# Patient Record
Sex: Female | Born: 1995 | Race: White | Hispanic: No | Marital: Single | Smoking: Never smoker
Health system: Southern US, Community
[De-identification: ages and names within clinical notes are randomized; demographics above are authoritative.]

## PROBLEM LIST (undated history)

## (undated) DIAGNOSIS — F419 Anxiety disorder, unspecified: Secondary | ICD-10-CM

## (undated) DIAGNOSIS — R51 Headache: Secondary | ICD-10-CM

## (undated) DIAGNOSIS — L309 Dermatitis, unspecified: Principal | ICD-10-CM

## (undated) DIAGNOSIS — F329 Major depressive disorder, single episode, unspecified: Secondary | ICD-10-CM

## (undated) DIAGNOSIS — R110 Nausea: Secondary | ICD-10-CM

## (undated) DIAGNOSIS — H6692 Otitis media, unspecified, left ear: Secondary | ICD-10-CM

## (undated) DIAGNOSIS — R519 Headache, unspecified: Secondary | ICD-10-CM

## (undated) DIAGNOSIS — J029 Acute pharyngitis, unspecified: Principal | ICD-10-CM

## (undated) DIAGNOSIS — IMO0001 Reserved for inherently not codable concepts without codable children: Secondary | ICD-10-CM

## (undated) DIAGNOSIS — K219 Gastro-esophageal reflux disease without esophagitis: Secondary | ICD-10-CM

## (undated) DIAGNOSIS — G4700 Insomnia, unspecified: Secondary | ICD-10-CM

## (undated) DIAGNOSIS — B019 Varicella without complication: Secondary | ICD-10-CM

## (undated) HISTORY — DX: Insomnia, unspecified: G47.00

## (undated) HISTORY — DX: Headache, unspecified: R51.9

## (undated) HISTORY — DX: Varicella without complication: B01.9

## (undated) HISTORY — DX: Headache: R51

## (undated) HISTORY — DX: Dermatitis, unspecified: L30.9

## (undated) HISTORY — DX: Nausea: R11.0

## (undated) HISTORY — DX: Reserved for inherently not codable concepts without codable children: IMO0001

## (undated) HISTORY — DX: Gastro-esophageal reflux disease without esophagitis: K21.9

## (undated) HISTORY — DX: Major depressive disorder, single episode, unspecified: F32.9

## (undated) HISTORY — DX: Otitis media, unspecified, left ear: H66.92

## (undated) HISTORY — DX: Anxiety disorder, unspecified: F41.9

## (undated) HISTORY — DX: Acute pharyngitis, unspecified: J02.9

---
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
## 2016-12-28 ENCOUNTER — Encounter: Payer: Self-pay | Admitting: Internal Medicine

## 2016-12-28 ENCOUNTER — Ambulatory Visit (INDEPENDENT_AMBULATORY_CARE_PROVIDER_SITE_OTHER): Payer: BLUE CROSS/BLUE SHIELD | Admitting: Internal Medicine

## 2016-12-28 VITALS — BP 108/74 | HR 92 | Temp 97.2°F | Resp 16 | Ht 63.0 in | Wt 113.0 lb

## 2016-12-28 DIAGNOSIS — J0410 Acute tracheitis without obstruction: Secondary | ICD-10-CM

## 2016-12-28 DIAGNOSIS — J029 Acute pharyngitis, unspecified: Secondary | ICD-10-CM | POA: Diagnosis not present

## 2016-12-28 MED ORDER — PREDNISONE 10 MG PO TABS: ORAL_TABLET | ORAL | 0 refills | Status: DC

## 2016-12-28 NOTE — Patient Instructions (Signed)

## 2016-12-28 NOTE — Progress Notes (Signed)
[facility]   Lucky Cowboy, M.D.    Dyanne Carrel. Steffanie Dunn, P.A.-C      Judd Gaudier, DNP  [facility]                [address]                [place], [place]. [address] Telephone [phone] Telefax [phone] _________________________________ Subjective:    Patient ID: Megan Backbone., female    DOB: 1995-12-26, 21 y.o.   MRN: [id number]  HPI  This nice single WF presents as a new patient with c/o ST x 3 days. Describes a itching sensation in the back of her throat. No sneezing, itchy / watery eyes or nose.  Has has a dry non-productive cough. No fever, chills or sweats.   Medication Sig  . escitalopram (LEXAPRO) 10 MG tablet Take 1 tablet (10 mg total) by mouth daily.   Allergies  Allergen Reactions  . Prilosec [Omeprazole] Nausea And Vomiting   Past Medical History:  Diagnosis Date  . Anxiety and depression 12/10/2011  . Chicken pox as a child  . Dermatitis 03/02/2012  . Headache disorder   . Insomnia 12/10/2011  . Nausea alone 07/10/2012  . Otitis media of left ear 07/22/2011  . Pharyngitis 07/22/2011  . Reflux    Review of Systems  10 point systems review negative except as above.    Objective:   Physical Exam  BP 108/74   Pulse 92   Temp (!) 97.2 F (36.2 C)   Resp 16   Ht  (1.6 m)   Wt 113 lb (51.3 kg)   BMI 20.02 kg/m   Dry cough. No Stridor. No Rash.   HEENT - EAC's patent. TM's Nl. EOM's full. PERRLA.No Sinus tenderness.  NasoOroPharynx clear. No exudates.  1+ erythema of anterior tonsillar fauces. Neck - supple. Nl Thyroid. Carotids 2+ & No bruits, JVD. Sl tender Lt anterior Cx LN.  Chest - Clear equal BS w/o ales, rhonchi, wheezes. Cor - Nl HS. RRR w/o sig MGR. PP 1(+). No edema. Abd - No palpable organomegaly, masses or tenderness. BS nl. MS- FROM w/o deformities.  Gait Nl. Neuro - No obvious Cr N abnormalities.  Nl w/o focal abnormalities.  Assessment & Plan:   1.  Pharyngitis suspect viral vs Allergy   2. Tracheitis  - predniSONE (DELTASONE) 10 MG tablet; 1 tab 3 x day for 2 days, then 1 tab 2 x day for 2 days, then 1 tab 1 x day for 3 days  Dispense: 13 tablet; Refill: 0  - discussed meds/SE's.   - Advised call for Abx,  if develops fever or purulent secrretions

## 2017-04-01 ENCOUNTER — Other Ambulatory Visit: Payer: Self-pay | Admitting: *Deleted

## 2017-04-01 DIAGNOSIS — F32A Depression, unspecified: Secondary | ICD-10-CM

## 2017-04-01 DIAGNOSIS — F329 Major depressive disorder, single episode, unspecified: Secondary | ICD-10-CM

## 2017-04-01 DIAGNOSIS — F419 Anxiety disorder, unspecified: Principal | ICD-10-CM

## 2017-04-01 MED ORDER — ESCITALOPRAM OXALATE 10 MG PO TABS: 10.0000 mg | ORAL_TABLET | Freq: Every day | ORAL | 0 refills | Status: DC

## 2017-06-16 ENCOUNTER — Encounter: Payer: Self-pay | Admitting: Adult Health

## 2017-07-01 ENCOUNTER — Ambulatory Visit (INDEPENDENT_AMBULATORY_CARE_PROVIDER_SITE_OTHER): Payer: BLUE CROSS/BLUE SHIELD | Admitting: Adult Health

## 2017-07-01 ENCOUNTER — Encounter: Payer: Self-pay | Admitting: Adult Health

## 2017-07-01 VITALS — BP 106/72 | HR 87 | Temp 97.9°F | Ht 63.0 in | Wt 118.0 lb

## 2017-07-01 DIAGNOSIS — L249 Irritant contact dermatitis, unspecified cause: Secondary | ICD-10-CM | POA: Diagnosis not present

## 2017-07-01 MED ORDER — TRIAMCINOLONE ACETONIDE 0.1 % EX OINT
1.0000 | TOPICAL_OINTMENT | Freq: Two times a day (BID) | CUTANEOUS | 1 refills | Status: AC
Start: 2017-07-01 — End: ?

## 2017-07-01 NOTE — Patient Instructions (Signed)

## 2017-07-01 NOTE — Progress Notes (Signed)
Assessment and Plan:  Megan Lawrence was seen today for rash.  Diagnoses and all orders for this visit:  Irritant contact dermatitis, unspecified trigger Wear gloves at work or when working with chemicals or other new products, avoid fragranced products or alcohol on hands. Use heavy moisturizer at night and after hand-washing. Apply prescription cream twice daily. Follow up if not improving in 4 weeks.  -     triamcinolone ointment (KENALOG) 0.1 %; Apply 1 application topically 2 (two) times daily.  Further disposition pending results of labs. Discussed med's effects and SE's.   Over 15 minutes of exam, counseling, chart review, and critical decision making was performed.   No future appointments.  ------------------------------------------------------------------------------------------------------------------   HPI BP 106/72   Pulse 87   Temp 97.9 F (36.6 C)   Ht 5\' 3"  (1.6 m)   Wt 118 lb (53.5 kg)   SpO2 97%   BMI 20.90 kg/m   22 y.o.female presents for concerns of red, cracked/irritated/itchy hands ongoing for 3 weeks. 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 She does not wear perfumed creams on her hands at home.   She denies other rashes, fever/chills/achiness, cough, nausea, dizziness, swelling or other new symptoms.   Past Medical History:  Diagnosis Date  . Anxiety and depression 12/10/2011  . Chicken pox as a child  . Dermatitis 03/02/2012  . Headache disorder   . Insomnia 12/10/2011  . Nausea alone 07/10/2012  . Otitis media of left ear 07/22/2011  . Pharyngitis 07/22/2011  . Reflux      Allergies  Allergen Reactions  . Prilosec [Omeprazole] Nausea And Vomiting    Current Outpatient Medications on File Prior to Visit  Medication Sig  . escitalopram (LEXAPRO) 10 MG tablet Take 1 tablet (10 mg total) by mouth daily.  . predniSONE (DELTASONE) 10 MG tablet 1 tab 3 x day for 2 days, then 1 tab 2 x day for 2  days, then 1 tab 1 x day for 3 days   No current facility-administered medications on file prior to visit.     ROS: all negative except above.   Physical Exam:  BP 106/72   Pulse 87   Temp 97.9 F (36.6 C)   Ht 5\' 3"  (1.6 m)   Wt 118 lb (53.5 kg)   SpO2 97%   BMI 20.90 kg/m   General Appearance: Well nourished, in no apparent distress. Eyes: PERRLA, EOMs, conjunctiva no swelling or erythema ENT/Mouth:  No erythema, swelling, or exudate on post pharynx.  Tonsils not swollen or erythematous. Hearing normal.  Neck: Supple.  Respiratory: Respiratory effort normal, BS equal bilaterally without rales, rhonchi, wheezing or stridor.  Cardio: RRR with no MRGs. Brisk peripheral pulses without edema.  Abdomen: Soft, + BS.  Non tender. Lymphatics: Non tender without lymphadenopathy.  Musculoskeletal: normal gait.  Skin: Warm, dry; bilateral hands with dry/erythematous/irritated with fissures. No distinct lesions or discharge.   Psych: Awake and oriented X 3, anxious affect, Insight and Judgment appropriate.     Megan MakerAshley C Lincoln Kleiner, NP 6:18 PM [facility] : [facility]

## 2017-08-06 ENCOUNTER — Other Ambulatory Visit: Payer: Self-pay | Admitting: Internal Medicine

## 2017-08-06 DIAGNOSIS — F32A Depression, unspecified: Secondary | ICD-10-CM

## 2017-08-06 DIAGNOSIS — F419 Anxiety disorder, unspecified: Principal | ICD-10-CM

## 2017-08-06 DIAGNOSIS — F329 Major depressive disorder, single episode, unspecified: Secondary | ICD-10-CM

## 2017-10-13 IMAGING — CR DG CHEST 2V
2 series · 2 of 2 positions shown · non-contrast
Comparison: None.

CLINICAL DATA: Bronchitis 2 months ago.  Lingering cough.

EXAM:
CHEST  2 VIEW

[w chest pa 8-[id] (15-22cm)]
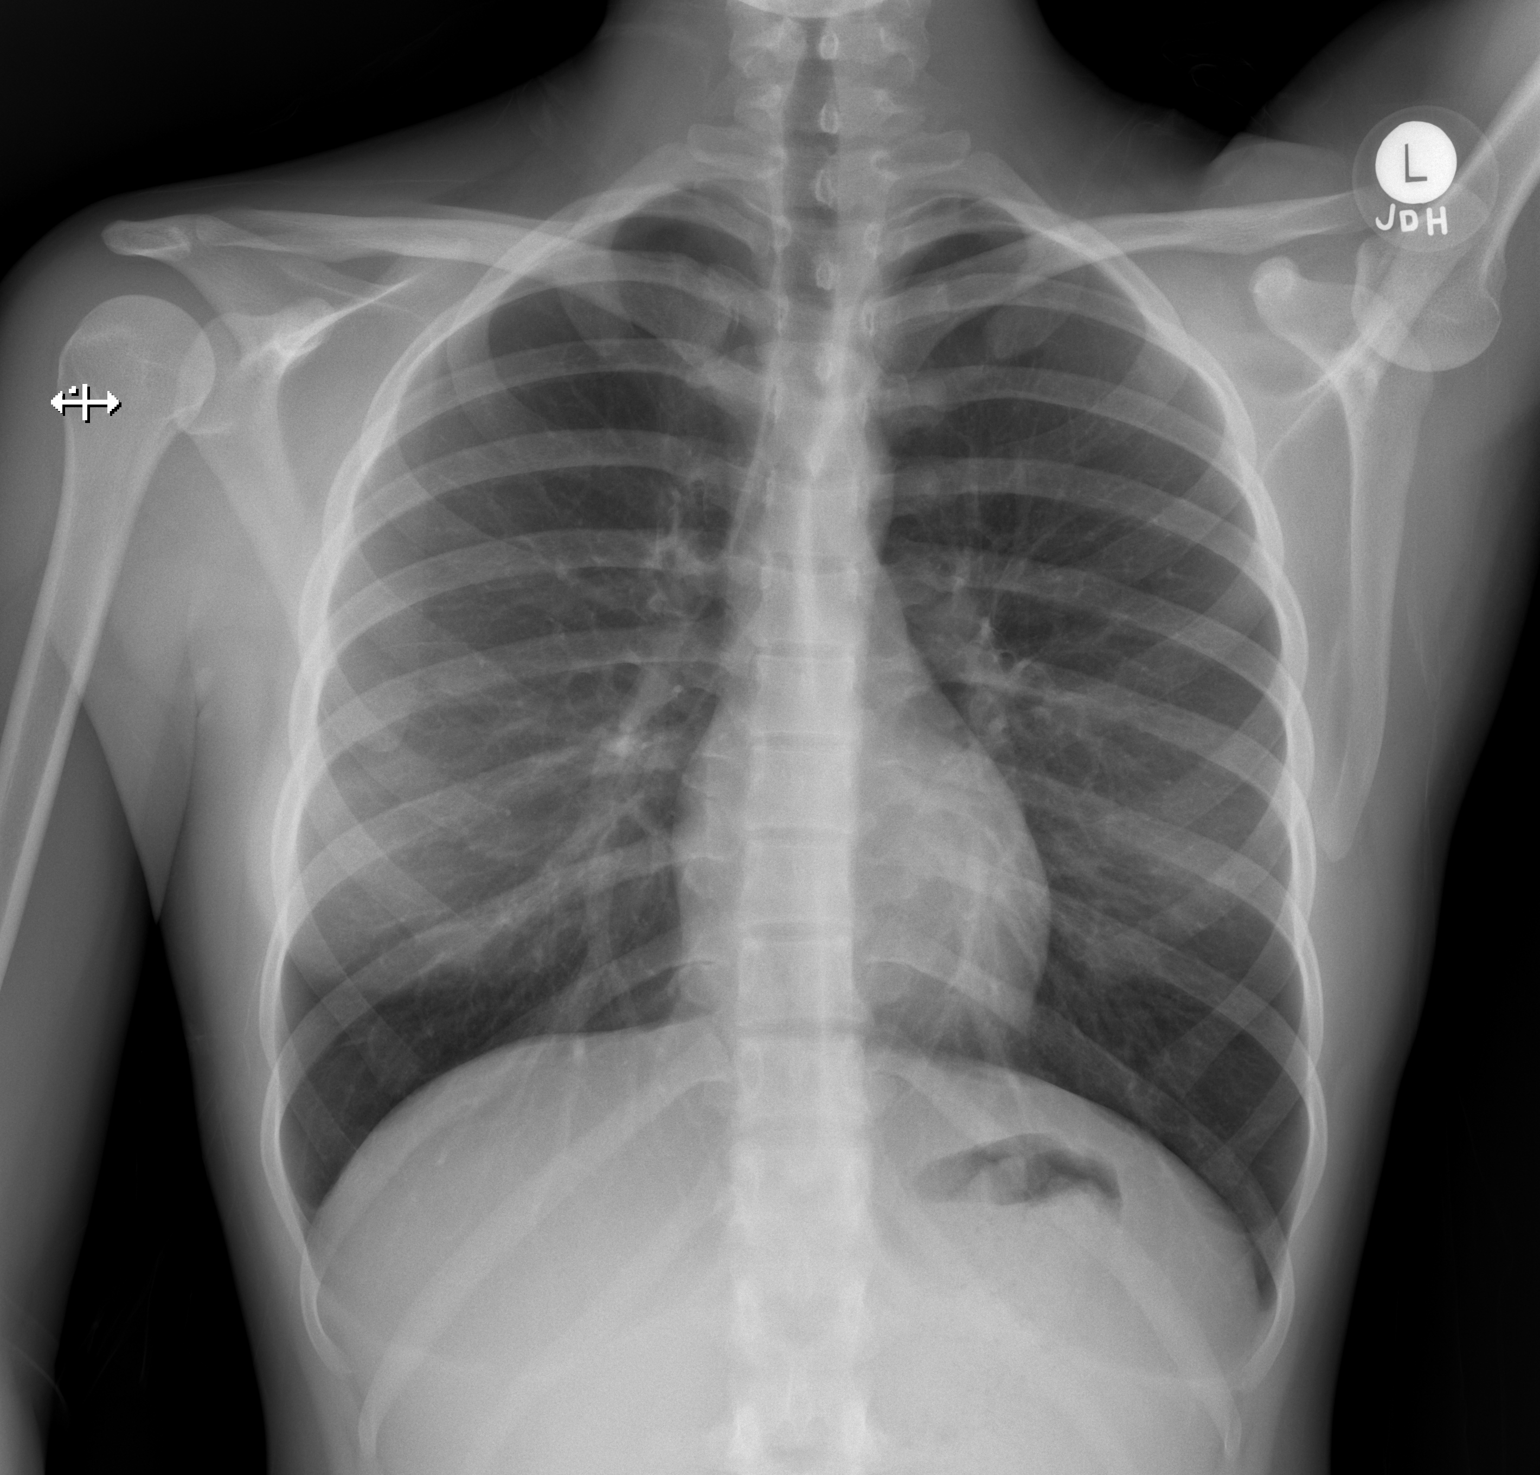

[w chest lat 8-[id] (21-28cm)]
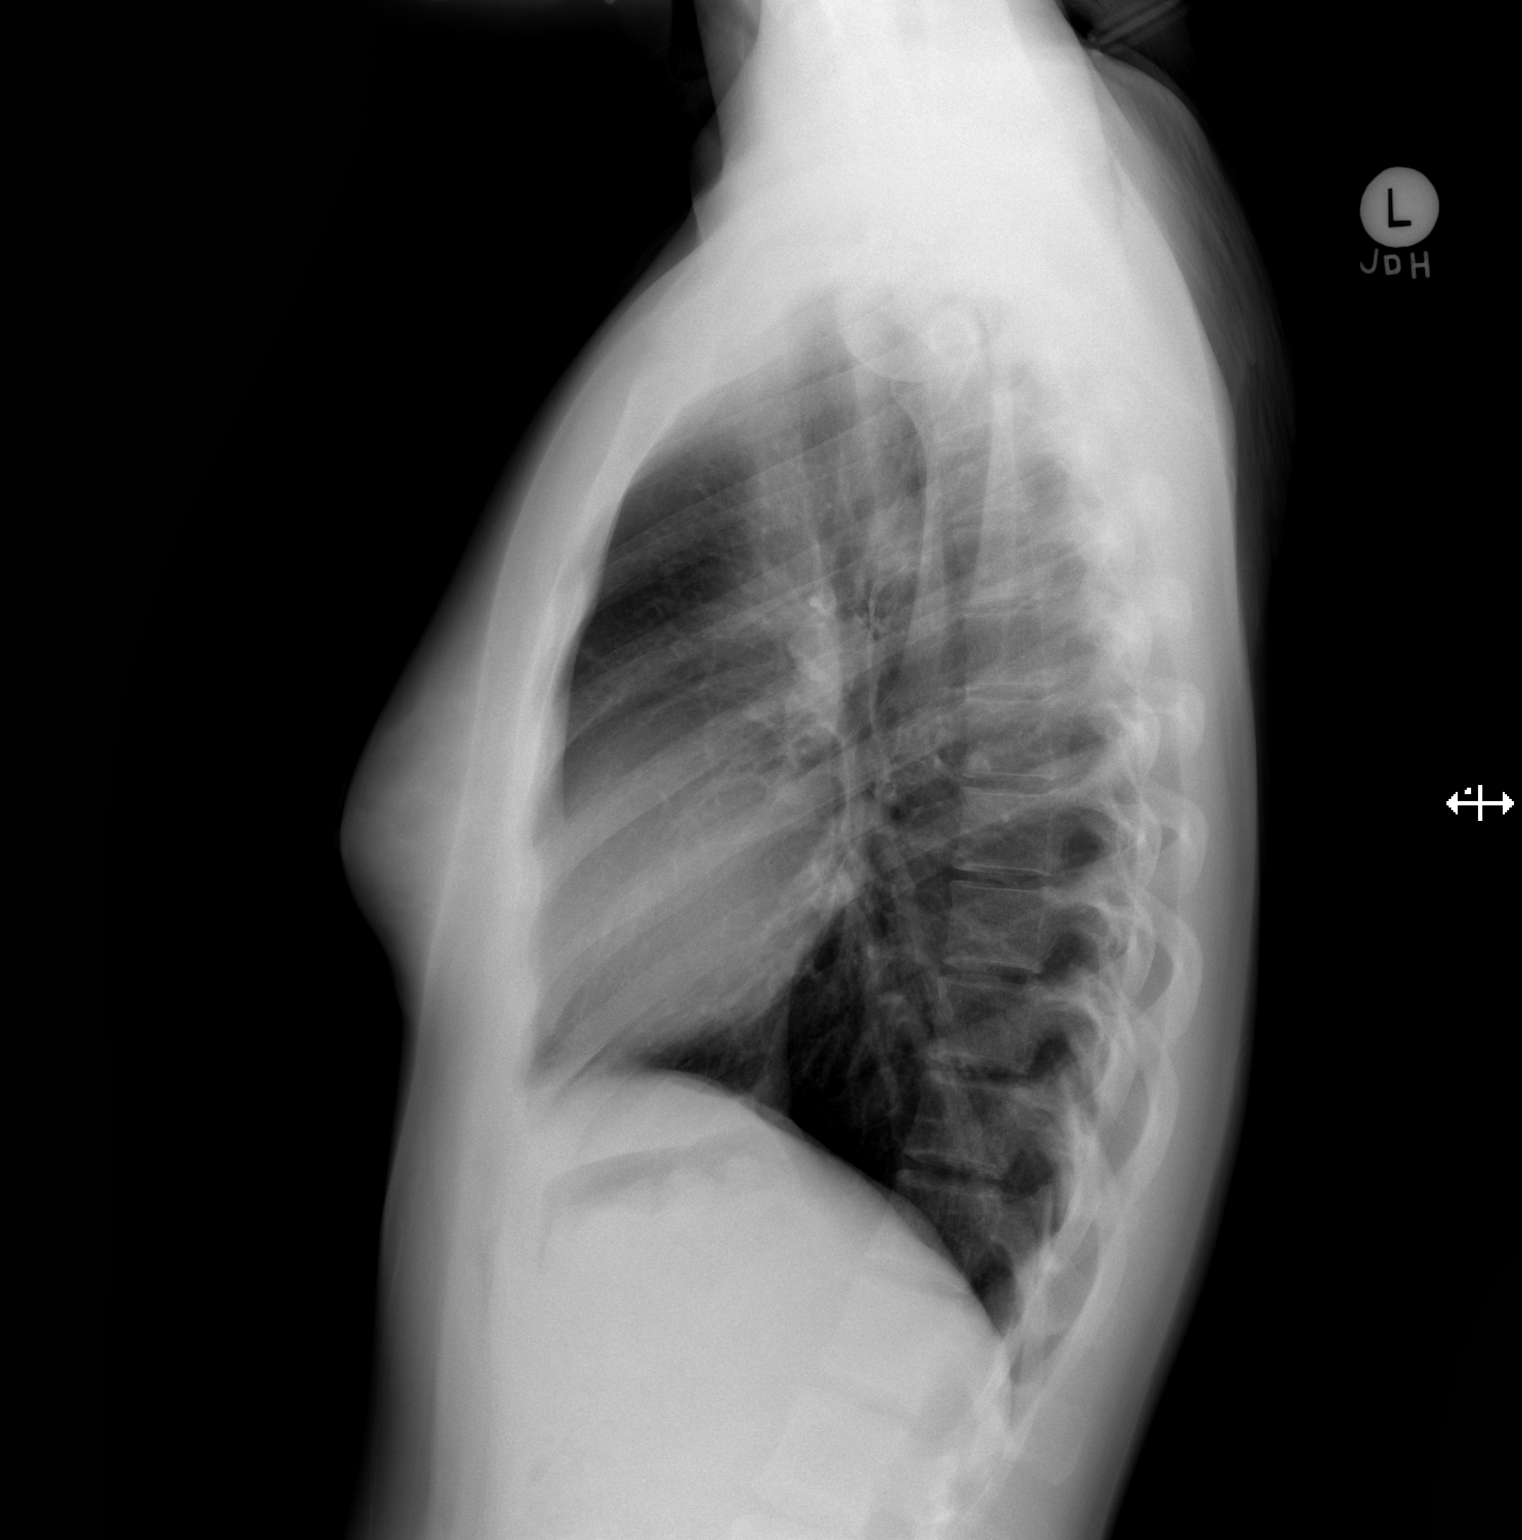

[2 of 2 positions shown; findings below may reference images not displayed]

FINDINGS: The heart size and mediastinal contours are within normal limits.
Both lungs are clear. The visualized skeletal structures are
unremarkable.
IMPRESSION: No active cardiopulmonary disease.

## 2017-11-24 ENCOUNTER — Other Ambulatory Visit: Payer: Self-pay | Admitting: Physician Assistant

## 2017-11-24 ENCOUNTER — Other Ambulatory Visit: Payer: Self-pay | Admitting: Internal Medicine

## 2017-11-24 DIAGNOSIS — F329 Major depressive disorder, single episode, unspecified: Secondary | ICD-10-CM

## 2017-11-24 DIAGNOSIS — F32A Depression, unspecified: Secondary | ICD-10-CM

## 2017-11-24 DIAGNOSIS — F419 Anxiety disorder, unspecified: Principal | ICD-10-CM

## 2018-03-09 ENCOUNTER — Encounter: Payer: Self-pay | Admitting: Adult Health
